# Patient Record
Sex: Male | Born: 1990 | Race: White | Hispanic: No | Marital: Single | Smoking: Current every day smoker
Health system: Southern US, Community
[De-identification: ages and names within clinical notes are randomized; demographics above are authoritative.]

## PROBLEM LIST (undated history)

## (undated) DIAGNOSIS — F988 Other specified behavioral and emotional disorders with onset usually occurring in childhood and adolescence: Secondary | ICD-10-CM

## (undated) HISTORY — DX: Other specified behavioral and emotional disorders with onset usually occurring in childhood and adolescence: F98.8

---
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
## 2010-03-16 ENCOUNTER — Emergency Department (HOSPITAL_BASED_OUTPATIENT_CLINIC_OR_DEPARTMENT_OTHER): Admission: EM | Admit: 2010-03-16 | Discharge: 2010-03-16 | Payer: Self-pay | Admitting: Emergency Medicine

## 2011-02-16 ENCOUNTER — Ambulatory Visit (INDEPENDENT_AMBULATORY_CARE_PROVIDER_SITE_OTHER): Payer: PRIVATE HEALTH INSURANCE | Admitting: Family

## 2011-02-16 ENCOUNTER — Encounter: Payer: Self-pay | Admitting: Family

## 2011-02-16 VITALS — BP 122/76 | HR 66 | Temp 97.8°F | Resp 16 | Ht 67.0 in | Wt 154.0 lb

## 2011-02-16 DIAGNOSIS — Z720 Tobacco use: Secondary | ICD-10-CM

## 2011-02-16 DIAGNOSIS — B360 Pityriasis versicolor: Secondary | ICD-10-CM | POA: Insufficient documentation

## 2011-02-16 DIAGNOSIS — Z5181 Encounter for therapeutic drug level monitoring: Secondary | ICD-10-CM

## 2011-02-16 DIAGNOSIS — F988 Other specified behavioral and emotional disorders with onset usually occurring in childhood and adolescence: Secondary | ICD-10-CM

## 2011-02-16 DIAGNOSIS — B354 Tinea corporis: Secondary | ICD-10-CM

## 2011-02-16 DIAGNOSIS — F17200 Nicotine dependence, unspecified, uncomplicated: Secondary | ICD-10-CM

## 2011-02-16 DIAGNOSIS — J45909 Unspecified asthma, uncomplicated: Secondary | ICD-10-CM

## 2011-02-16 LAB — HEPATIC FUNCTION PANEL
ALT: 12 U/L (ref 0–53)
AST: 27 U/L (ref 0–37)
Alkaline Phosphatase: 74 U/L (ref 39–117)
Indirect Bilirubin: 0.4 mg/dL (ref 0.0–0.9)
Total Protein: 7.2 g/dL (ref 6.0–8.3)

## 2011-02-16 LAB — BASIC METABOLIC PANEL
CO2: 28 mEq/L (ref 19–32)
Chloride: 100 mEq/L (ref 96–112)
Sodium: 138 mEq/L (ref 135–145)

## 2011-02-16 MED ORDER — TERBINAFINE HCL 250 MG PO TABS: 250.0000 mg | ORAL_TABLET | Freq: Every day | ORAL | Status: AC

## 2011-02-16 MED ORDER — VARENICLINE TARTRATE 0.5 MG X 11 & 1 MG X 42 PO MISC: ORAL | Status: AC

## 2011-02-16 MED ORDER — VARENICLINE TARTRATE 1 MG PO TABS: 1.0000 mg | ORAL_TABLET | Freq: Two times a day (BID) | ORAL | Status: AC

## 2011-02-16 NOTE — Progress Notes (Signed)
Subjective:    Patient ID: Nicholas [name], male    DOB: 11/15/1990, 20 y.o.   MRN: [id number]  HPI  [name] is a 20 yr old male who presents today to establish care.  He has a history of ADD.  Recently graduated and works at [employer] and [employer].  He reports that he is unable to concentrate on many things for a period of time.  "zones off." Was treated in the past and underwent evaluation as a child and was told that he had ADD.  Last used Adderall 4-5 yrs ago.  He previously saw Dr. Ivory Broad.  Asthma- has not bothered him "in a long time."  Rare asthma attack.  Last episode was 1 year ago.    Tobacco abuse- wants to quit.  Interested in Chantix.  He has tried the nicotine patch.  He has tried the patch and gum without success.  Rash- has round lesions on the abdomen.   Review of Systems  Constitutional: Negative for fever and unexpected weight change.  HENT: Negative for hearing loss.   Eyes: Negative for visual disturbance.  Respiratory: Negative for cough.   Cardiovascular: Negative for chest pain.  Gastrointestinal: Negative for nausea, vomiting and diarrhea.  Genitourinary: Negative for dysuria and hematuria.  Musculoskeletal: Positive for back pain. Negative for myalgias.  Neurological:       Occasional headaches.  Hematological: Negative for adenopathy.  Psychiatric/Behavioral:       Denies hx of depression.  Notes some anxiety when he gets "stress out." this happens about once a month.    Past Medical History  Diagnosis Date  . Asthma     childhood  . ADD (attention deficit disorder)     History   Social History  . Marital Status: Single    Spouse Name: N/A    Number of Children: 0  . Years of Education: N/A   Occupational History  .  [employer]   Social History Main Topics  . Smoking status: Current Everyday Smoker -- 1.0 packs/day for 6 years    Types: Cigarettes  . Smokeless tobacco: Never Used  . Alcohol Use: No  . Drug Use: No  . Sexually  Active: Not on file   Other Topics Concern  . Not on file   Social History Narrative   Regular Exercise:  2-3 x weeklyCaffeine Use: 1 daily    Past Surgical History  Procedure Date  . External ear surgery 2010    bilateral "plastic surgery"    Family History  Problem Relation Age of Onset  . Arthritis Mother   . Diabetes Maternal Grandmother     No Known Allergies  No current outpatient prescriptions on file prior to visit.    BP 122/76  Pulse 66  Temp(Src) 97.8 F (36.6 C) (Oral)  Resp 16  Ht 5\' 7"  (1.702 m)  Wt 154 lb (69.854 kg)  BMI 24.12 kg/m2       Objective:   Physical Exam  Constitutional: He is oriented to person, place, and time. He appears well-developed and well-nourished.  HENT:  Head: Normocephalic and atraumatic.  Cardiovascular: Normal rate and regular rhythm.   Pulmonary/Chest: Effort normal and breath sounds normal.  Abdominal: Soft. Bowel sounds are normal. He exhibits no distension and no mass. There is no tenderness. There is no rebound and no guarding.  Musculoskeletal: He exhibits no edema.  Neurological: He is alert and oriented to person, place, and time.  Skin:       +  tattoos noted on bilateral arms. Several Pink round raised lesions noted on abdomen  Psychiatric: He has a normal mood and affect. His behavior is normal. Judgment and thought content normal.          Assessment & Plan:

## 2011-02-16 NOTE — Assessment & Plan Note (Signed)
Pt reports that this is stable.

## 2011-02-16 NOTE — Patient Instructions (Addendum)
Complete your lab work on the first floor.  Call if you develop depression or suicidal thoughts on chantix. Quit smoking within 1 week after starting chantix. You will be contacted about your referral for your ADD evaluation.  Call if your rash worsens or does not resolve.  Follow up in 3 months.

## 2011-02-16 NOTE — Assessment & Plan Note (Signed)
Patient was counseled on smoking cessation for approximately 5 minutes today. He will be started on Chantix. Side effects were discussed including the risk of worsening depression symptoms, dizziness, nausea, and vivid dreams.

## 2011-02-16 NOTE — Assessment & Plan Note (Addendum)
Uncontrolled.I discussed with the patient and that he will need to undergo a formal evaluation for ADD before I will be able to prescribe any further Adderall. He has been off the medication now for 4-5 years and has not had an evaluation since he was young child. We'll make referral to [facility] for further evaluation.

## 2011-02-16 NOTE — Assessment & Plan Note (Signed)
Will treat with oral Lamisil for 2 weeks. Check baseline creatinine and liver function testing today.

## 2011-02-18 ENCOUNTER — Encounter: Payer: Self-pay | Admitting: Family

## 2011-05-20 ENCOUNTER — Ambulatory Visit: Payer: PRIVATE HEALTH INSURANCE | Admitting: Family

## 2011-05-20 DIAGNOSIS — Z0289 Encounter for other administrative examinations: Secondary | ICD-10-CM

## 2012-02-22 ENCOUNTER — Emergency Department (HOSPITAL_COMMUNITY)
Admission: EM | Admit: 2012-02-22 | Discharge: 2012-02-22 | Disposition: A | Discharge status: Home / Self Care | Payer: Worker's Compensation | Attending: Emergency Medicine | Admitting: Emergency Medicine

## 2012-02-22 ENCOUNTER — Emergency Department (HOSPITAL_COMMUNITY): Payer: Worker's Compensation

## 2012-02-22 ENCOUNTER — Encounter (HOSPITAL_COMMUNITY): Payer: Self-pay | Admitting: Emergency Medicine

## 2012-02-22 DIAGNOSIS — J45909 Unspecified asthma, uncomplicated: Secondary | ICD-10-CM | POA: Insufficient documentation

## 2012-02-22 DIAGNOSIS — F988 Other specified behavioral and emotional disorders with onset usually occurring in childhood and adolescence: Secondary | ICD-10-CM | POA: Insufficient documentation

## 2012-02-22 DIAGNOSIS — W240XXA Contact with lifting devices, not elsewhere classified, initial encounter: Secondary | ICD-10-CM | POA: Insufficient documentation

## 2012-02-22 DIAGNOSIS — Y9289 Other specified places as the place of occurrence of the external cause: Secondary | ICD-10-CM | POA: Insufficient documentation

## 2012-02-22 DIAGNOSIS — S9780XA Crushing injury of unspecified foot, initial encounter: Secondary | ICD-10-CM | POA: Insufficient documentation

## 2012-02-22 DIAGNOSIS — Z87891 Personal history of nicotine dependence: Secondary | ICD-10-CM | POA: Insufficient documentation

## 2012-02-22 MED ORDER — IBUPROFEN 800 MG PO TABS: 800.0000 mg | ORAL_TABLET | Freq: Three times a day (TID) | ORAL | Status: AC

## 2012-02-22 MED ORDER — ONDANSETRON HCL 4 MG PO TABS: 4.0000 mg | ORAL_TABLET | Freq: Four times a day (QID) | ORAL | Status: AC

## 2012-02-22 MED ORDER — ONDANSETRON 4 MG PO TBDP
4.0000 mg | ORAL_TABLET | Freq: Once | ORAL | Status: AC
  Administered 2012-02-22: 4 mg via ORAL
  Filled 2012-02-22: qty 1

## 2012-02-22 MED ORDER — OXYCODONE-ACETAMINOPHEN 5-325 MG PO TABS
2.0000 | ORAL_TABLET | Freq: Once | ORAL | Status: AC
  Administered 2012-02-22: 2 via ORAL
  Filled 2012-02-22: qty 2

## 2012-02-22 MED ORDER — HYDROCODONE-ACETAMINOPHEN 5-500 MG PO TABS: 1.0000 | ORAL_TABLET | Freq: Four times a day (QID) | ORAL | Status: AC | PRN

## 2012-02-22 NOTE — Progress Notes (Signed)
Orthopedic Tech Progress Note Patient Details:  Nicholas [name] [birth year] [id number]  Ortho Devices Type of Ortho Device: Crutches;CAM walker Ortho Device/Splint Location: left LE Ortho Device/Splint Interventions: Application Pt expressed he is comfortable using crutches.  Linn Clavin T 02/22/2012, 11:55 AM

## 2012-02-22 NOTE — ED Notes (Signed)
Ortho at bedside placing cam walker and giving crutches with instructions.

## 2012-02-22 NOTE — ED Notes (Signed)
Patient claims works for [employer].  Patient claims his coworker accidentally ran over his L foot/ankle with a forklift.

## 2012-02-22 NOTE — ED Provider Notes (Signed)
History     CSN: [id number]  Arrival date & time 02/22/12  7829   First MD Initiated Contact with Patient 02/22/12 [phone]      Chief Complaint  Patient presents with  . Foot Injury    (Consider location/radiation/quality/duration/timing/severity/associated sxs/prior treatment) HPI  Patient presents to the ER with complaints of left foot injury. The patient was at work when a fork lift ran over his foot just prior to arrival at the ED. The foot is bruised and swollen with no open wounds. He states it hurts to walk on it. He denies falling down, hitting his head, injuring his neck, or LOC. The pt denies numbness or tingling. NAD/VSS  Past Medical History  Diagnosis Date  . Asthma     childhood  . ADD (attention deficit disorder)     Past Surgical History  Procedure Date  . External ear surgery 2010    bilateral "plastic surgery"    Family History  Problem Relation Age of Onset  . Arthritis Mother   . Diabetes Maternal Grandmother     History  Substance Use Topics  . Smoking status: Former Smoker -- 6 years    Types: Cigarettes  . Smokeless tobacco: Never Used  . Alcohol Use: No      Review of Systems   HEENT: denies blurry vision or change in hearing PULMONARY: Denies difficulty breathing and SOB CARDIAC: denies chest pain or heart palpitations MUSCULOSKELETAL:  + injury to left foot ABDOMEN AL: denies abdominal pain GU: denies loss of bowel or urinary control NEURO: denies numbness and tingling in extremities SKIN: no new rashes PSYCH: patient denies anxiety or depression. NECK: Pt denies having neck pain     Allergies  Review of patient's allergies indicates no known allergies.  Home Medications   Current Outpatient Rx  Name Route Sig Dispense Refill  . ADULT MULTIVITAMIN W/MINERALS CH Oral Take 1 tablet by mouth daily.    Marland Kitchen VARENICLINE TARTRATE 0.5 MG PO TABS Oral Take 0.5 mg by mouth 2 (two) times daily.      BP 142/83  Pulse 105  Temp  98.2 F (36.8 C) (Oral)  Resp 18  SpO2 100%  Physical Exam  Nursing note and vitals reviewed. Constitutional: He appears well-developed and well-nourished. No distress.  HENT:  Head: Normocephalic and atraumatic.  Eyes: Pupils are equal, round, and reactive to light.  Neck: Normal range of motion. Neck supple.  Cardiovascular: Normal rate and regular rhythm.   Pulmonary/Chest: Effort normal.  Abdominal: Soft.  Musculoskeletal:       Left foot: He exhibits decreased range of motion, tenderness, bony tenderness and swelling. He exhibits normal capillary refill, no crepitus, no deformity and no laceration.       Feet:       Lateral portion of patients foot is very swollen with mod/severe tenderness. Skin is warm and moist, pedal pulse and capillary refill are adequate. The patient has no open wounds or deformities.   Neurological: He is alert.  Skin: Skin is warm and dry.    ED Course  Procedures (including critical care time)  Labs Reviewed - No data to display Dg Ankle Complete Left  02/22/2012  *RADIOLOGY REPORT*  Clinical Data: Injury with pain and swelling  LEFT ANKLE COMPLETE - 3+ VIEW  Comparison: None.  Findings: No evidence of fracture, dislocation or joint effusion.  IMPRESSION: Negative radiographs  Original Report Authenticated By: Thomasenia Sales, M.D.   Ct Foot Left Wo Contrast  02/22/2012  *  RADIOLOGY REPORT*  Clinical Data: Injured foot.  Run over by a fork lift.  Question of cuboid fracture on radiographs.  CT OF THE LEFT FOOT WITHOUT CONTRAST  Technique:  Multidetector CT imaging was performed according to the standard protocol. Multiplanar CT image reconstructions were also generated.  Comparison: Radiographs 02/22/2012.  Findings: No acute foot fracture is identified.  The cuboid abnormality on the radiographs is a small intraosseous lipoma. There is a large, 2.3 cm, benign bone cyst in the calcaneus also.  The tibiotalar joint spaces maintained.  No osteochondral  abnormality.  The subtalar joints are maintained.  Os trigonum is noted.  The midfoot bony structures are intact.  Joint spaces are maintained.  There is significant dorsal soft tissue swelling over the midfoot and forefoot likely from direct trauma.  IMPRESSION:  1.  No acute foot fracture. 2.  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 3.  Large benign bone cyst in the calcaneus. 4.  Significant dorsal soft tissue swelling/edema involving the forefoot and midfoot.  Original Report Authenticated By: P. Loralie Champagne, M.D.   Dg Foot Complete Left  02/22/2012  *RADIOLOGY REPORT*  Clinical Data: Injury.  Pain and swelling.  LEFT FOOT - COMPLETE 3+ VIEW  Comparison: None  Findings:  On the oblique view, there is question of a the cuboid fracture.  I do not see any acute white abnormality on the other projections. No other abnormalities suspected.  IMPRESSION: Possible small fracture of the cuboid suggested only on the oblique view.  Other views are normal.  Is there tenderness in this location?  Original Report Authenticated By: Thomasenia Sales, M.D.     1. Cuboid fracture       MDM  I consulted Dr. Lestine Box about patients xray findings, he suggests CT of Foot for a better view and to r/o occult fractures. Turns out, CT Foot shows no cuboidal fracture but that the pt has a lipoma. No fractures noted. Significant swelling. Pt educated about risk for compartment syndrome. Pt given referral to Dr. Lestine Box office. Given cam walker boot, crutches, pain medication and a work note. RICE guidelines given.  Pt has been advised of the symptoms that warrant their return to the ED. Patient has voiced understanding and has agreed to follow-up with the PCP or specialist.         Dorthula Matas, PA 02/22/12 1127

## 2012-02-24 NOTE — ED Provider Notes (Signed)
Medical screening examination/treatment/procedure(s) were performed by non-physician practitioner and as supervising physician I was immediately available for consultation/collaboration.  Evelina Lore R. Vern Guerette, MD 02/24/12 1053 

## 2012-07-17 ENCOUNTER — Emergency Department (HOSPITAL_COMMUNITY): Payer: Worker's Compensation

## 2012-07-17 ENCOUNTER — Emergency Department (HOSPITAL_COMMUNITY)
Admission: EM | Admit: 2012-07-17 | Discharge: 2012-07-17 | Disposition: A | Discharge status: Home / Self Care | Payer: Worker's Compensation | Attending: Emergency Medicine | Admitting: Emergency Medicine

## 2012-07-17 DIAGNOSIS — T148XXA Other injury of unspecified body region, initial encounter: Secondary | ICD-10-CM

## 2012-07-17 DIAGNOSIS — J45909 Unspecified asthma, uncomplicated: Secondary | ICD-10-CM | POA: Insufficient documentation

## 2012-07-17 DIAGNOSIS — Y9269 Other specified industrial and construction area as the place of occurrence of the external cause: Secondary | ICD-10-CM | POA: Insufficient documentation

## 2012-07-17 DIAGNOSIS — F988 Other specified behavioral and emotional disorders with onset usually occurring in childhood and adolescence: Secondary | ICD-10-CM | POA: Insufficient documentation

## 2012-07-17 DIAGNOSIS — Y9389 Activity, other specified: Secondary | ICD-10-CM | POA: Insufficient documentation

## 2012-07-17 DIAGNOSIS — Z87891 Personal history of nicotine dependence: Secondary | ICD-10-CM | POA: Insufficient documentation

## 2012-07-17 DIAGNOSIS — W268XXA Contact with other sharp object(s), not elsewhere classified, initial encounter: Secondary | ICD-10-CM | POA: Insufficient documentation

## 2012-07-17 DIAGNOSIS — S91309A Unspecified open wound, unspecified foot, initial encounter: Secondary | ICD-10-CM | POA: Insufficient documentation

## 2012-07-17 DIAGNOSIS — Z23 Encounter for immunization: Secondary | ICD-10-CM | POA: Insufficient documentation

## 2012-07-17 MED ORDER — CIPROFLOXACIN HCL 500 MG PO TABS
500.0000 mg | ORAL_TABLET | Freq: Once | ORAL | Status: AC
  Administered 2012-07-17: 500 mg via ORAL
  Filled 2012-07-17: qty 1

## 2012-07-17 MED ORDER — TETANUS-DIPHTH-ACELL PERTUSSIS 5-2.5-18.5 LF-MCG/0.5 IM SUSP
0.5000 mL | Freq: Once | INTRAMUSCULAR | Status: AC
  Administered 2012-07-17: 0.5 mL via INTRAMUSCULAR
  Filled 2012-07-17: qty 0.5

## 2012-07-17 MED ORDER — HYDROCODONE-ACETAMINOPHEN 5-325 MG PO TABS: 1.0000 | ORAL_TABLET | ORAL | Status: DC | PRN

## 2012-07-17 MED ORDER — CIPROFLOXACIN HCL 500 MG PO TABS: 500.0000 mg | ORAL_TABLET | Freq: Two times a day (BID) | ORAL | Status: AC

## 2012-07-17 NOTE — ED Provider Notes (Signed)
Medical screening examination/treatment/procedure(s) were performed by non-physician practitioner and as supervising physician I was immediately available for consultation/collaboration.   Dione Booze, MD 07/17/12 [phone]

## 2012-07-17 NOTE — ED Notes (Signed)
Pt soaking L foot in warm water and diluted chlorahexinate.

## 2012-07-17 NOTE — ED Notes (Signed)
Pt stepped on a nail this morning with his left foot. Pt states nail went at least 3/4-1 inch into his foot and then came out. Pt c/o swelling and pain to L foot. Pt ambulatory at triage.

## 2012-07-17 NOTE — ED Provider Notes (Signed)
History     CSN: [id number]  Arrival date & time 07/17/12  1748   First MD Initiated Contact with Patient 07/17/12 1816      Chief Complaint  Patient presents with  . Foot Injury    (Consider location/radiation/quality/duration/timing/severity/associated sxs/prior treatment) Patient is a 21 y.o. male presenting with foot injury. The history is provided by the patient.  Foot Injury  The incident occurred 6 to 12 hours ago. The incident occurred at work. Injury mechanism: He stepped on a nail causing puncture would through his shoe. He reports no foreign bodies present.    Past Medical History  Diagnosis Date  . Asthma     childhood  . ADD (attention deficit disorder)     Past Surgical History  Procedure Date  . External ear surgery 2010    bilateral "plastic surgery"    Family History  Problem Relation Age of Onset  . Arthritis Mother   . Diabetes Maternal Grandmother     History  Substance Use Topics  . Smoking status: Former Smoker -- 6 years    Types: Cigarettes  . Smokeless tobacco: Never Used  . Alcohol Use: No      Review of Systems  Constitutional: Negative for fever.  Skin: Positive for wound.    Allergies  Review of patient's allergies indicates no known allergies.  Home Medications   Current Outpatient Rx  Name  Route  Sig  Dispense  Refill  . ACETAMINOPHEN 325 MG PO TABS   Oral   Take 325 mg by mouth every 6 (six) hours as needed. pain         . ADULT MULTIVITAMIN W/MINERALS CH   Oral   Take 1 tablet by mouth daily.           BP 129/72  Pulse 76  Temp 98.5 F (36.9 C) (Oral)  Resp 16  SpO2 100%  Physical Exam  Constitutional: He is oriented to person, place, and time. He appears well-developed and well-nourished. No distress.  Pulmonary/Chest: Effort normal.  Neurological: He is alert and oriented to person, place, and time.  Skin:       Small puncture wound heel of left foot. No associated swelling, drainage or  redness.     ED Course  Procedures (including critical care time)  Labs Reviewed - No data to display No results found.   No diagnosis found.  1. Puncture wound, foot  MDM  Tetanus updated, foot cleaned. Abx started, pain relief given.        Rodena Medin, PA-C 07/17/12 [phone]

## 2013-02-11 ENCOUNTER — Emergency Department (HOSPITAL_BASED_OUTPATIENT_CLINIC_OR_DEPARTMENT_OTHER): Payer: PRIVATE HEALTH INSURANCE

## 2013-02-11 ENCOUNTER — Emergency Department (HOSPITAL_BASED_OUTPATIENT_CLINIC_OR_DEPARTMENT_OTHER)
Admission: EM | Admit: 2013-02-11 | Discharge: 2013-02-11 | Disposition: A | Discharge status: Home / Self Care | Payer: PRIVATE HEALTH INSURANCE | Attending: Emergency Medicine | Admitting: Emergency Medicine

## 2013-02-11 ENCOUNTER — Encounter (HOSPITAL_BASED_OUTPATIENT_CLINIC_OR_DEPARTMENT_OTHER): Payer: Self-pay | Admitting: *Deleted

## 2013-02-11 DIAGNOSIS — R002 Palpitations: Secondary | ICD-10-CM | POA: Insufficient documentation

## 2013-02-11 DIAGNOSIS — R0789 Other chest pain: Secondary | ICD-10-CM

## 2013-02-11 DIAGNOSIS — J45901 Unspecified asthma with (acute) exacerbation: Secondary | ICD-10-CM | POA: Insufficient documentation

## 2013-02-11 DIAGNOSIS — Z87891 Personal history of nicotine dependence: Secondary | ICD-10-CM | POA: Insufficient documentation

## 2013-02-11 DIAGNOSIS — Z8659 Personal history of other mental and behavioral disorders: Secondary | ICD-10-CM | POA: Insufficient documentation

## 2013-02-11 LAB — BASIC METABOLIC PANEL
CO2: 28 mEq/L (ref 19–32)
Chloride: 101 mEq/L (ref 96–112)
GFR calc Af Amer: >90 mL/min (ref >90)
Potassium: 3.7 mEq/L (ref 3.5–5.1)

## 2013-02-11 LAB — TROPONIN I: Troponin I: <0.3 ng/mL (ref <0.30)

## 2013-02-11 LAB — CBC WITH DIFFERENTIAL/PLATELET
Basophils Absolute: 0.1 10*3/uL (ref 0.0–0.1)
Basophils Relative: 1 % (ref 0–1)
Hemoglobin: 14.3 g/dL (ref 13.0–17.0)
Lymphocytes Relative: 40 % (ref 12–46)
MCHC: 34.8 g/dL (ref 30.0–36.0)
Neutro Abs: 2.9 10*3/uL (ref 1.7–7.7)
Neutrophils Relative %: 47 % (ref 43–77)
RDW: 12.2 % (ref 11.5–15.5)
WBC: 6.2 10*3/uL (ref 4.0–10.5)

## 2013-02-11 MED ORDER — MORPHINE SULFATE 4 MG/ML IJ SOLN
4.0000 mg | Freq: Once | INTRAMUSCULAR | Status: AC
  Administered 2013-02-11: 4 mg via INTRAVENOUS
  Filled 2013-02-11: qty 1

## 2013-02-11 MED ORDER — NITROGLYCERIN 0.4 MG SL SUBL
0.4000 mg | SUBLINGUAL_TABLET | SUBLINGUAL | Status: DC | PRN
  Filled 2013-02-11: qty 25

## 2013-02-11 MED ORDER — ASPIRIN 81 MG PO CHEW
324.0000 mg | CHEWABLE_TABLET | Freq: Once | ORAL | Status: AC
  Administered 2013-02-11: 324 mg via ORAL
  Filled 2013-02-11: qty 4

## 2013-02-11 NOTE — ED Provider Notes (Signed)
Medical screening examination/treatment/procedure(s) were performed by non-physician practitioner and as supervising physician I was immediately available for consultation/collaboration.   Torryn Fiske, MD 02/11/13 2247 

## 2013-02-11 NOTE — ED Notes (Signed)
Feels like his heart is beating irregularly. Chest pain on and off for 3 weeks. Denies drug use. States he has been smoking more and has been under a lot of stress.

## 2013-02-11 NOTE — ED Provider Notes (Signed)
History    CSN: [id number] Arrival date & time 02/11/13  1847  First MD Initiated Contact with Patient 02/11/13 1859     Chief Complaint  Patient presents with  . Palpitations   (Consider location/radiation/quality/duration/timing/severity/associated sxs/prior Treatment) HPI Comments: Patient presents to the emergency department with chief complaint of chest pressure. He states that he has had the pressure for the past 3 weeks. He states that his been constant, and nothing makes it better or worse. He has not tried anything to alleviate his symptoms. He states that he does smoke a lot, but wants to quit. He denies any recreational drug use. He endorses associated shortness of breath, but this is not exertional. Cardiac risk factors include smoking history only.  The history is provided by the patient. No language interpreter was used.   Past Medical History  Diagnosis Date  . Asthma     childhood  . ADD (attention deficit disorder)    Past Surgical History  Procedure Laterality Date  . External ear surgery  2010    bilateral "plastic surgery"   Family History  Problem Relation Age of Onset  . Arthritis Mother   . Diabetes Maternal Grandmother    History  Substance Use Topics  . Smoking status: Former Smoker -- 6 years    Types: Cigarettes  . Smokeless tobacco: Never Used  . Alcohol Use: No    Review of Systems  All other systems reviewed and are negative.    Allergies  Review of patient's allergies indicates no known allergies.  Home Medications   Current Outpatient Rx  Name  Route  Sig  Dispense  Refill  . acetaminophen (TYLENOL) 325 MG tablet   Oral   Take 325 mg by mouth every 6 (six) hours as needed. pain         . ciprofloxacin (CIPRO) 500 MG tablet   Oral   Take 1 tablet (500 mg total) by mouth every 12 (twelve) hours.   20 tablet   0   . HYDROcodone-acetaminophen (NORCO/VICODIN) 5-325 MG per tablet   Oral   Take 1-2 tablets by mouth every 4  (four) hours as needed for pain.   15 tablet   0   . Multiple Vitamin (MULTIVITAMIN WITH MINERALS) TABS   Oral   Take 1 tablet by mouth daily.          BP 133/75  Pulse 87  Temp(Src) 98.1 F (36.7 C) (Oral)  Resp 22  Wt 154 lb (69.854 kg)  BMI 24.11 kg/m2  SpO2 100% Physical Exam  Nursing note and vitals reviewed. Constitutional: He is oriented to person, place, and time. He appears well-developed and well-nourished.  HENT:  Head: Normocephalic and atraumatic.  Eyes: Conjunctivae and EOM are normal. Pupils are equal, round, and reactive to light. Right eye exhibits no discharge. Left eye exhibits no discharge. No scleral icterus.  Neck: Normal range of motion. Neck supple. No JVD present.  Cardiovascular: Normal rate, regular rhythm, normal heart sounds and intact distal pulses.  Exam reveals no gallop and no friction rub.   No murmur heard. Pulmonary/Chest: Effort normal and breath sounds normal. No respiratory distress. He has no wheezes. He has no rales. He exhibits no tenderness.  Abdominal: Soft. Bowel sounds are normal. He exhibits no distension and no mass. There is no tenderness. There is no rebound and no guarding.  Musculoskeletal: Normal range of motion. He exhibits no edema and no tenderness.  Neurological: He is alert and oriented to  person, place, and time.  CN 3-12 intact  Skin: Skin is warm and dry.  Psychiatric: He has a normal mood and affect. His behavior is normal. Judgment and thought content normal.    ED Course  Procedures (including critical care time) Labs Reviewed  CBC WITH DIFFERENTIAL  BASIC METABOLIC PANEL  TROPONIN I  ED ECG REPORT  I personally interpreted this EKG   Date: 02/11/2013   Rate: 78  Rhythm: normal sinus rhythm  QRS Axis: normal  Intervals: normal  ST/T Wave abnormalities: J-point elevations in anterior leads  Conduction Disutrbances:right bundle branch block  Narrative Interpretation:   Old EKG Reviewed: none  available  Results for orders placed during the hospital encounter of 02/11/13  CBC WITH DIFFERENTIAL      Result Value Range   WBC 6.2  4.0 - 10.5 K/uL   RBC 4.53  4.22 - 5.81 MIL/uL   Hemoglobin 14.3  13.0 - 17.0 g/dL   HCT 45.4  09.8 - 11.9 %   MCV 90.7  78.0 - 100.0 fL   MCH 31.6  26.0 - 34.0 pg   MCHC 34.8  30.0 - 36.0 g/dL   RDW 14.7  82.9 - 56.2 %   Platelets 283  150 - 400 K/uL   Neutrophils Relative % 47  43 - 77 %   Neutro Abs 2.9  1.7 - 7.7 K/uL   Lymphocytes Relative 40  12 - 46 %   Lymphs Abs 2.5  0.7 - 4.0 K/uL   Monocytes Relative 10  3 - 12 %   Monocytes Absolute 0.6  0.1 - 1.0 K/uL   Eosinophils Relative 3  0 - 5 %   Eosinophils Absolute 0.2  0.0 - 0.7 K/uL   Basophils Relative 1  0 - 1 %   Basophils Absolute 0.1  0.0 - 0.1 K/uL  BASIC METABOLIC PANEL      Result Value Range   Sodium 139  135 - 145 mEq/L   Potassium 3.7  3.5 - 5.1 mEq/L   Chloride 101  96 - 112 mEq/L   CO2 28  19 - 32 mEq/L   Glucose, Bld 107 (*) 70 - 99 mg/dL   BUN 14  6 - 23 mg/dL   Creatinine, Ser 1.30  0.50 - 1.35 mg/dL   Calcium 9.7  8.4 - 86.5 mg/dL   GFR calc non Af Amer >90  >90 mL/min   GFR calc Af Amer >90  >90 mL/min  TROPONIN I      Result Value Range   Troponin I <0.30  <0.30 ng/mL   Dg Chest 2 View  02/11/2013   *RADIOLOGY REPORT*  Clinical Data: Shortness of breath  CHEST - 2 VIEW  Comparison: None.  Findings: Lungs clear.  Heart size and pulmonary vascularity are normal.  No adenopathy.  No bone lesions.  IMPRESSION: No abnormality noted.   Original Report Authenticated By: Bretta Bang, M.D.     1. Chest pressure   2. Palpitations     MDM  Patient with chest pressure x3 weeks. He states that recently worsened, we'll check basic labs, and reevaluate.    8:48 PM Patient states that he is feeling a little better after treatment in the emergency department. Labs and imaging a reassuring. Patient states that he drinks a lot of energy drinks. I suspect that this  could be the cause of his palpitations. Have instructed him to quit using them. Cardiology followup.  Patient discussed with Dr. Fredderick Phenix, who  agrees with the plan.   Roxy Horseman, PA-C 02/11/13 2048

## 2013-03-19 ENCOUNTER — Emergency Department (HOSPITAL_BASED_OUTPATIENT_CLINIC_OR_DEPARTMENT_OTHER)
Admission: EM | Admit: 2013-03-19 | Discharge: 2013-03-19 | Disposition: A | Discharge status: Home / Self Care | Payer: PRIVATE HEALTH INSURANCE | Attending: Emergency Medicine | Admitting: Emergency Medicine

## 2013-03-19 ENCOUNTER — Encounter (HOSPITAL_BASED_OUTPATIENT_CLINIC_OR_DEPARTMENT_OTHER): Payer: Self-pay | Admitting: *Deleted

## 2013-03-19 DIAGNOSIS — Z8659 Personal history of other mental and behavioral disorders: Secondary | ICD-10-CM | POA: Insufficient documentation

## 2013-03-19 DIAGNOSIS — J45909 Unspecified asthma, uncomplicated: Secondary | ICD-10-CM | POA: Insufficient documentation

## 2013-03-19 DIAGNOSIS — IMO0001 Reserved for inherently not codable concepts without codable children: Secondary | ICD-10-CM | POA: Insufficient documentation

## 2013-03-19 DIAGNOSIS — F17200 Nicotine dependence, unspecified, uncomplicated: Secondary | ICD-10-CM | POA: Insufficient documentation

## 2013-03-19 DIAGNOSIS — F1120 Opioid dependence, uncomplicated: Secondary | ICD-10-CM | POA: Insufficient documentation

## 2013-03-19 DIAGNOSIS — IMO0002 Reserved for concepts with insufficient information to code with codable children: Secondary | ICD-10-CM

## 2013-03-19 LAB — URINALYSIS, ROUTINE W REFLEX MICROSCOPIC
Bilirubin Urine: NEGATIVE
Glucose, UA: NEGATIVE mg/dL
Hgb urine dipstick: NEGATIVE
Ketones, ur: 15 mg/dL — AB
Leukocytes, UA: NEGATIVE
pH: 5.5 (ref 5.0–8.0)

## 2013-03-19 LAB — RAPID URINE DRUG SCREEN, HOSP PERFORMED
Benzodiazepines: NOT DETECTED
Cocaine: NOT DETECTED
Opiates: POSITIVE — AB

## 2013-03-19 MED ORDER — CLONIDINE HCL 0.1 MG PO TABS
0.1000 mg | ORAL_TABLET | Freq: Once | ORAL | Status: AC
  Administered 2013-03-19: 0.1 mg via ORAL
  Filled 2013-03-19: qty 1

## 2013-03-19 MED ORDER — DIPHENHYDRAMINE HCL 25 MG PO CAPS
25.0000 mg | ORAL_CAPSULE | Freq: Once | ORAL | Status: AC
  Administered 2013-03-19: 25 mg via ORAL
  Filled 2013-03-19: qty 1

## 2013-03-19 MED ORDER — ONDANSETRON 4 MG PO TBDP
4.0000 mg | ORAL_TABLET | Freq: Once | ORAL | Status: AC
  Administered 2013-03-19: 4 mg via ORAL
  Filled 2013-03-19 (×2): qty 1

## 2013-03-19 MED ORDER — ONDANSETRON 4 MG PO TBDP: 4.0000 mg | ORAL_TABLET | Freq: Three times a day (TID) | ORAL | Status: AC | PRN

## 2013-03-19 MED ORDER — CLONIDINE HCL 0.2 MG PO TABS: 0.2000 mg | ORAL_TABLET | Freq: Two times a day (BID) | ORAL | Status: AC

## 2013-03-19 NOTE — ED Notes (Signed)
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 States that uses regularly for the past 4-5 months. Last use of both was 12 hours ago.

## 2013-03-19 NOTE — ED Provider Notes (Signed)
CSN: [id number]     Arrival date & time 03/19/13  1558 History   First MD Initiated Contact with Patient 03/19/13 1625     Chief Complaint  Patient presents with  . Addiction Problem   (Consider location/radiation/quality/duration/timing/severity/associated sxs/prior Treatment) Patient is a 22 y.o. male presenting with drug problem. The history is provided by the patient. No language interpreter was used.  Drug Problem This is a new problem. Episode onset: 4 months. The problem occurs constantly. The problem has been gradually worsening. Associated symptoms include myalgias. Nothing aggravates the symptoms. He has tried nothing for the symptoms.  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   Pt reports he is trying to get into [facility] for inpatient treatment.  Pt last used 14 hours ago.  Pt complains of withdrawal.  Pt requesting medication to help with withdrawal.    Past Medical History  Diagnosis Date  . Asthma     childhood  . ADD (attention deficit disorder)    Past Surgical History  Procedure Laterality Date  . External ear surgery  2010    bilateral "plastic surgery"   Family History  Problem Relation Age of Onset  . Arthritis Mother   . Diabetes Maternal Grandmother    History  Substance Use Topics  . Smoking status: Current Every Day Smoker -- 6 years    Types: Cigarettes  . Smokeless tobacco: Never Used  . Alcohol Use: Yes    Review of Systems  Musculoskeletal: Positive for myalgias.  All other systems reviewed and are negative.    Allergies  Review of patient's allergies indicates no known allergies.  Home Medications   Current Outpatient Rx  Name  Route  Sig  Dispense  Refill  . acetaminophen (TYLENOL) 325 MG tablet   Oral   Take 325 mg by mouth every 6 (six) hours as needed. pain         . ciprofloxacin (CIPRO) 500 MG tablet   Oral   Take 1 tablet (500 mg total) by mouth every 12 (twelve) hours.   20 tablet   0   . HYDROcodone-acetaminophen  (NORCO/VICODIN) 5-325 MG per tablet   Oral   Take 1-2 tablets by mouth every 4 (four) hours as needed for pain.   15 tablet   0   . Multiple Vitamin (MULTIVITAMIN WITH MINERALS) TABS   Oral   Take 1 tablet by mouth daily.          BP 116/68  Pulse 95  Temp(Src) 98.6 F (37 C) (Oral)  Resp 14  Ht 5\' 8"  (1.727 m)  Wt 160 lb (72.576 kg)  BMI 24.33 kg/m2  SpO2 99% Physical Exam  Nursing note and vitals reviewed. Constitutional: He is oriented to person, place, and time. He appears well-developed and well-nourished.  HENT:  Head: Normocephalic.  Eyes: Pupils are equal, round, and reactive to light.  Neck: Normal range of motion.  Cardiovascular: Normal rate, regular rhythm and normal heart sounds.   Pulmonary/Chest: Effort normal.  Abdominal: Soft.  Neurological: He is alert and oriented to person, place, and time. He has normal reflexes.  Skin: Skin is warm.  Psychiatric: He has a normal mood and affect.    ED Course  Procedures (including critical care time) Labs Review Labs Reviewed  URINE RAPID DRUG SCREEN (HOSP PERFORMED) - Abnormal; Notable for the following:    Opiates POSITIVE (*)    Amphetamines POSITIVE (*)    Tetrahydrocannabinol POSITIVE (*)    All other components within  normal limits  URINALYSIS, ROUTINE W REFLEX MICROSCOPIC - Abnormal; Notable for the following:    Ketones, ur 15 (*)    All other components within normal limits   Imaging Review No results found.  MDM   1. Substance abuse or dependence    Pt given clonidine, zofran and benadryl.   Pt feels better.  I obtained treatment program list from [facility].  Pt also given number for intensive outpt treatment program.   Pt given rx for clonidine and zofran      Elson Areas, PA-C 03/19/13 2012  Lonia Skinner [place], [place] 03/19/13 2014

## 2013-03-20 NOTE — ED Provider Notes (Signed)
Medical screening examination/treatment/procedure(s) were performed by non-physician practitioner and as supervising physician I was immediately available for consultation/collaboration.   Audree Camel, MD 03/20/13 [id number]

## 2013-04-26 ENCOUNTER — Encounter: Payer: Self-pay | Admitting: Family Medicine

## 2013-04-26 ENCOUNTER — Ambulatory Visit (INDEPENDENT_AMBULATORY_CARE_PROVIDER_SITE_OTHER): Payer: PRIVATE HEALTH INSURANCE | Admitting: Family Medicine

## 2013-04-26 VITALS — BP 126/64 | HR 85 | Temp 98.5°F | Resp 16 | Wt 165.5 lb

## 2013-04-26 DIAGNOSIS — B360 Pityriasis versicolor: Secondary | ICD-10-CM

## 2013-04-26 MED ORDER — KETOCONAZOLE 2 % EX CREA: TOPICAL_CREAM | Freq: Every day | CUTANEOUS | Status: AC

## 2013-04-26 NOTE — Assessment & Plan Note (Signed)
New.  Pt's sxs and PE consistent w/ tinea versicolor.  Start ketoconzaole daily.  Reviewed supportive care and red flags that should prompt return.  Pt expressed understanding and is in agreement w/ plan.

## 2013-04-26 NOTE — Patient Instructions (Addendum)
Apply the Ketozonazole cream daily Try and keep skin clean and dry Hang in there!!  Tinea Versicolor Tinea versicolor is a common yeast infection of the skin. This condition becomes known when the yeast on our skin starts to overgrow (yeast is a normal inhabitant on our skin). This condition is noticed as white or light brown patches on brown skin, and is more evident in the summer on tanned skin. These areas are slightly scaly if scratched. The light patches from the yeast become evident when the yeast creates "holes in your suntan". This is most often noticed in the summer. The patches are usually located on the chest, back, pubis, neck and body folds. However, it may occur on any area of body. Mild itching and inflammation (redness or soreness) may be present. DIAGNOSIS  The diagnosisof this is made clinically (by looking). Cultures from samples are usually not needed. Examination under the microscope may help. However, yeast is normally found on skin. The diagnosis still remains clinical. Examination under Wood's Ultraviolet Light can determine the extent of the infection. TREATMENT  This common infection is usually only of cosmetic (only a concern to your appearance). It is easily treated with dandruff shampoo used during showers or bathing. Vigorous scrubbing will eliminate the yeast over several days time. The light areas in your skin may remain for weeks or months after the infection is cured unless your skin is exposed to sunlight. The lighter or darker spots caused by the fungus that remain after complete treatment are not a sign of treatment failure; it will take a long time to resolve. Your caregiver may recommend a number of commercial preparations or medication by mouth if home care is not working. Recurrence is common and preventative medication may be necessary. This skin condition is not highly contagious. Special care is not needed to protect close friends and family members. Normal  hygiene is usually enough. Follow up is required only if you develop complications (such as a secondary infection from scratching), if recommended by your caregiver, or if no relief is obtained from the preparations used. Document Released: 07/08/2000 Document Revised: 10/03/2011 Document Reviewed: 08/20/2008 [facility] Patient Information 2014 [place], [place].

## 2013-04-26 NOTE — Progress Notes (Signed)
  Subjective:    Patient ID: Nicholas [name], male    DOB: 1991/03/07, 22 y.o.   MRN: [id number]  HPI Rash- pt reports started 'as the size of a dime' 2 yrs ago.  Has now enlarged to encompass abd, chest, back.  Intermittently itchy.  More pronounced w/ heat or sweat.  Tends to improve during the winter.   Review of Systems For ROS see HPI     Objective:   Physical Exam  Vitals reviewed. Constitutional: He appears well-developed and well-nourished. No distress.  Skin: Skin is warm and dry. Rash (tan, slightly raised, diffuse papules on trunk) noted.          Assessment & Plan:

## 2014-09-21 ENCOUNTER — Emergency Department (HOSPITAL_BASED_OUTPATIENT_CLINIC_OR_DEPARTMENT_OTHER)
Admission: EM | Admit: 2014-09-21 | Discharge: 2014-09-21 | Disposition: A | Discharge status: Home / Self Care | Payer: Commercial Managed Care - PPO | Attending: Emergency Medicine | Admitting: Emergency Medicine

## 2014-09-21 ENCOUNTER — Emergency Department (HOSPITAL_BASED_OUTPATIENT_CLINIC_OR_DEPARTMENT_OTHER): Payer: Commercial Managed Care - PPO

## 2014-09-21 ENCOUNTER — Encounter (HOSPITAL_BASED_OUTPATIENT_CLINIC_OR_DEPARTMENT_OTHER): Payer: Self-pay | Admitting: Emergency Medicine

## 2014-09-21 DIAGNOSIS — F1510 Other stimulant abuse, uncomplicated: Secondary | ICD-10-CM | POA: Insufficient documentation

## 2014-09-21 DIAGNOSIS — Z8659 Personal history of other mental and behavioral disorders: Secondary | ICD-10-CM | POA: Insufficient documentation

## 2014-09-21 DIAGNOSIS — Z79899 Other long term (current) drug therapy: Secondary | ICD-10-CM | POA: Insufficient documentation

## 2014-09-21 DIAGNOSIS — Y998 Other external cause status: Secondary | ICD-10-CM | POA: Diagnosis not present

## 2014-09-21 DIAGNOSIS — Y92410 Unspecified street and highway as the place of occurrence of the external cause: Secondary | ICD-10-CM | POA: Diagnosis not present

## 2014-09-21 DIAGNOSIS — J45909 Unspecified asthma, uncomplicated: Secondary | ICD-10-CM | POA: Insufficient documentation

## 2014-09-21 DIAGNOSIS — S299XXA Unspecified injury of thorax, initial encounter: Secondary | ICD-10-CM | POA: Diagnosis present

## 2014-09-21 DIAGNOSIS — F1210 Cannabis abuse, uncomplicated: Secondary | ICD-10-CM | POA: Diagnosis not present

## 2014-09-21 DIAGNOSIS — S20212A Contusion of left front wall of thorax, initial encounter: Secondary | ICD-10-CM | POA: Diagnosis not present

## 2014-09-21 DIAGNOSIS — T148XXA Other injury of unspecified body region, initial encounter: Secondary | ICD-10-CM

## 2014-09-21 DIAGNOSIS — Y9389 Activity, other specified: Secondary | ICD-10-CM | POA: Insufficient documentation

## 2014-09-21 DIAGNOSIS — Z720 Tobacco use: Secondary | ICD-10-CM | POA: Diagnosis not present

## 2014-09-21 LAB — RAPID URINE DRUG SCREEN, HOSP PERFORMED
AMPHETAMINES: POSITIVE — AB
BENZODIAZEPINES: NOT DETECTED
Barbiturates: NOT DETECTED
Cocaine: NOT DETECTED
Opiates: NOT DETECTED
Tetrahydrocannabinol: POSITIVE — AB

## 2014-09-21 LAB — CBC WITH DIFFERENTIAL/PLATELET
BASOS ABS: 0 10*3/uL (ref 0.0–0.1)
BASOS PCT: 0 % (ref 0–1)
EOS ABS: 0 10*3/uL (ref 0.0–0.7)
Eosinophils Relative: 0 % (ref 0–5)
HEMATOCRIT: 42.6 % (ref 39.0–52.0)
Hemoglobin: 14.7 g/dL (ref 13.0–17.0)
Lymphocytes Relative: 14 % (ref 12–46)
Lymphs Abs: 1.8 10*3/uL (ref 0.7–4.0)
MCH: 30.4 pg (ref 26.0–34.0)
MCHC: 34.5 g/dL (ref 30.0–36.0)
MCV: 88 fL (ref 78.0–100.0)
MONO ABS: 0.8 10*3/uL (ref 0.1–1.0)
Monocytes Relative: 6 % (ref 3–12)
NEUTROS ABS: 10.4 10*3/uL — AB (ref 1.7–7.7)
NEUTROS PCT: 80 % — AB (ref 43–77)
PLATELETS: 304 10*3/uL (ref 150–400)
RBC: 4.84 MIL/uL (ref 4.22–5.81)
RDW: 12 % (ref 11.5–15.5)
WBC: 13.1 10*3/uL — ABNORMAL HIGH (ref 4.0–10.5)

## 2014-09-21 MED ORDER — KETOROLAC TROMETHAMINE 30 MG/ML IJ SOLN
30.0000 mg | Freq: Once | INTRAMUSCULAR | Status: AC
  Administered 2014-09-21: 30 mg via INTRAVENOUS
  Filled 2014-09-21: qty 1

## 2014-09-21 MED ORDER — AMOXICILLIN 500 MG PO CAPS: 500.0000 mg | ORAL_CAPSULE | Freq: Three times a day (TID) | ORAL | Status: AC

## 2014-09-21 MED ORDER — IOHEXOL 300 MG/ML  SOLN
100.0000 mL | Freq: Once | INTRAMUSCULAR | Status: AC | PRN
  Administered 2014-09-21: 100 mL via INTRAVENOUS

## 2014-09-21 MED ORDER — METHOCARBAMOL 500 MG PO TABS
1000.0000 mg | ORAL_TABLET | Freq: Once | ORAL | Status: AC
  Administered 2014-09-21: 1000 mg via ORAL
  Filled 2014-09-21: qty 2

## 2014-09-21 MED ORDER — MELOXICAM 7.5 MG PO TABS: 7.5000 mg | ORAL_TABLET | Freq: Every day | ORAL | Status: AC

## 2014-09-21 MED ORDER — METHOCARBAMOL 500 MG PO TABS: 500.0000 mg | ORAL_TABLET | Freq: Two times a day (BID) | ORAL | Status: AC
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
## 2014-09-21 NOTE — Discharge Instructions (Signed)
Cryotherapy °Cryotherapy means treatment with cold. Ice or gel packs can be used to reduce both pain and swelling. Ice is the most helpful within the first 24 to 48 hours after an injury or flare-up from overusing a muscle or joint. Sprains, strains, spasms, burning pain, shooting pain, and aches can all be eased with ice. Ice can also be used when recovering from surgery. Ice is effective, has very few side effects, and is safe for most people to use. °PRECAUTIONS  °Ice is not a safe treatment option for people with: °· Raynaud phenomenon. This is a condition affecting small blood vessels in the extremities. Exposure to cold may cause your problems to return. °· Cold hypersensitivity. There are many forms of cold hypersensitivity, including: °¨ Cold urticaria. Red, itchy hives appear on the skin when the tissues begin to warm after being iced. °¨ Cold erythema. This is a red, itchy rash caused by exposure to cold. °¨ Cold hemoglobinuria. Red blood cells break down when the tissues begin to warm after being iced. The hemoglobin that carry oxygen are passed into the urine because they cannot combine with blood proteins fast enough. °· Numbness or altered sensitivity in the area being iced. °If you have any of the following conditions, do not use ice until you have discussed cryotherapy with your caregiver: °· Heart conditions, such as arrhythmia, angina, or chronic heart disease. °· High blood pressure. °· Healing wounds or open skin in the area being iced. °· Current infections. °· Rheumatoid arthritis. °· Poor circulation. °· Diabetes. °Ice slows the blood flow in the region it is applied. This is beneficial when trying to stop inflamed tissues from spreading irritating chemicals to surrounding tissues. However, if you expose your skin to cold temperatures for too long or without the proper protection, you can damage your skin or nerves. Watch for signs of skin damage due to cold. °HOME CARE INSTRUCTIONS °Follow  these tips to use ice and cold packs safely. °· Place a dry or damp towel between the ice and skin. A damp towel will cool the skin more quickly, so you may need to shorten the time that the ice is used. °· For a more rapid response, add gentle compression to the ice. °· Ice for no more than 10 to 20 minutes at a time. The bonier the area you are icing, the less time it will take to get the benefits of ice. °· Check your skin after 5 minutes to make sure there are no signs of a poor response to cold or skin damage. °· Rest 20 minutes or more between uses. °· Once your skin is numb, you can end your treatment. You can test numbness by very lightly touching your skin. The touch should be so light that you do not see the skin dimple from the pressure of your fingertip. When using ice, most people will feel these normal sensations in this order: cold, burning, aching, and numbness. °· Do not use ice on someone who cannot communicate their responses to pain, such as small children or people with dementia. °HOW TO MAKE AN ICE PACK °Ice packs are the most common way to use ice therapy. Other methods include ice massage, ice baths, and cryosprays. Muscle creams that cause a cold, tingly feeling do not offer the same benefits that ice offers and should not be used as a substitute unless recommended by your caregiver. °To make an ice pack, do one of the following: °· Place crushed ice or a   bag of frozen vegetables in a sealable plastic bag. Squeeze out the excess air. Place this bag inside another plastic bag. Slide the bag into a pillowcase or place a damp towel between your skin and the bag. °· Mix 3 parts water with 1 part rubbing alcohol. Freeze the mixture in a sealable plastic bag. When you remove the mixture from the freezer, it will be slushy. Squeeze out the excess air. Place this bag inside another plastic bag. Slide the bag into a pillowcase or place a damp towel between your skin and the bag. °SEEK MEDICAL CARE  IF: °· You develop white spots on your skin. This may give the skin a blotchy (mottled) appearance. °· Your skin turns blue or pale. °· Your skin becomes waxy or hard. °· Your swelling gets worse. °MAKE SURE YOU:  °· Understand these instructions. °· Will watch your condition. °· Will get help right away if you are not doing well or get worse. °Document Released: 03/07/2011 Document Revised: 11/25/2013 Document Reviewed: 03/07/2011 °[employer]® Patient Information ©2015 [employer]. This information is not intended to replace advice given to you by your health care provider. Make sure you discuss any questions you have with your health care provider. ° °
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
## 2014-09-21 NOTE — ED Notes (Signed)
Patient transported back to room from radiology, family at bedside.

## 2014-09-24 LAB — I-STAT CHEM 8, ED
BUN: 21 mg/dL (ref 6–23)
CALCIUM ION: 1.21 mmol/L (ref 1.12–1.23)
CHLORIDE: 102 mmol/L (ref 96–112)
CREATININE: 1.1 mg/dL (ref 0.50–1.35)
Glucose, Bld: 130 mg/dL — ABNORMAL HIGH (ref 70–99)
Potassium: 3.5 mmol/L (ref 3.5–5.1)
Sodium: 140 mmol/L (ref 135–145)
TCO2: 23 mmol/L (ref 0–100)

## 2016-10-25 ENCOUNTER — Emergency Department (HOSPITAL_COMMUNITY): Payer: Commercial Managed Care - PPO

## 2016-10-25 ENCOUNTER — Emergency Department (HOSPITAL_COMMUNITY)
Admission: EM | Admit: 2016-10-25 | Discharge: 2016-10-26 | Disposition: A | Discharge status: Home / Self Care | Payer: Commercial Managed Care - PPO | Attending: Emergency Medicine | Admitting: Emergency Medicine

## 2016-10-25 ENCOUNTER — Encounter (HOSPITAL_COMMUNITY): Payer: Self-pay

## 2016-10-25 DIAGNOSIS — F17210 Nicotine dependence, cigarettes, uncomplicated: Secondary | ICD-10-CM | POA: Diagnosis not present

## 2016-10-25 DIAGNOSIS — N50811 Right testicular pain: Secondary | ICD-10-CM | POA: Insufficient documentation

## 2016-10-25 DIAGNOSIS — Z79899 Other long term (current) drug therapy: Secondary | ICD-10-CM | POA: Diagnosis not present

## 2016-10-25 DIAGNOSIS — R1030 Lower abdominal pain, unspecified: Secondary | ICD-10-CM | POA: Diagnosis present

## 2016-10-25 DIAGNOSIS — J45909 Unspecified asthma, uncomplicated: Secondary | ICD-10-CM | POA: Insufficient documentation

## 2016-10-25 DIAGNOSIS — F909 Attention-deficit hyperactivity disorder, unspecified type: Secondary | ICD-10-CM | POA: Insufficient documentation

## 2016-10-25 DIAGNOSIS — R1031 Right lower quadrant pain: Secondary | ICD-10-CM | POA: Diagnosis not present

## 2016-10-25 LAB — URINALYSIS, ROUTINE W REFLEX MICROSCOPIC
Bilirubin Urine: NEGATIVE
Glucose, UA: NEGATIVE mg/dL
Hgb urine dipstick: NEGATIVE
Ketones, ur: NEGATIVE mg/dL
Leukocytes, UA: NEGATIVE
NITRITE: NEGATIVE
PH: 7 (ref 5.0–8.0)
Protein, ur: NEGATIVE mg/dL
SPECIFIC GRAVITY, URINE: 1.017 (ref 1.005–1.030)

## 2016-10-25 LAB — CBC WITH DIFFERENTIAL/PLATELET
BASOS PCT: 1 %
Basophils Absolute: 0.1 10*3/uL (ref 0.0–0.1)
EOS ABS: 0.3 10*3/uL (ref 0.0–0.7)
Eosinophils Relative: 3 %
HEMATOCRIT: 42.1 % (ref 39.0–52.0)
HEMOGLOBIN: 14.7 g/dL (ref 13.0–17.0)
LYMPHS ABS: 2.6 10*3/uL (ref 0.7–4.0)
Lymphocytes Relative: 31 %
MCH: 30.7 pg (ref 26.0–34.0)
MCHC: 34.9 g/dL (ref 30.0–36.0)
MCV: 87.9 fL (ref 78.0–100.0)
MONOS PCT: 7 %
Monocytes Absolute: 0.6 10*3/uL (ref 0.1–1.0)
NEUTROS PCT: 58 %
Neutro Abs: 4.9 10*3/uL (ref 1.7–7.7)
Platelets: 349 10*3/uL (ref 150–400)
RBC: 4.79 MIL/uL (ref 4.22–5.81)
RDW: 12.9 % (ref 11.5–15.5)
WBC: 8.4 10*3/uL (ref 4.0–10.5)

## 2016-10-25 LAB — BASIC METABOLIC PANEL
Anion gap: 8 (ref 5–15)
BUN: 16 mg/dL (ref 6–20)
CHLORIDE: 102 mmol/L (ref 101–111)
CO2: 29 mmol/L (ref 22–32)
Calcium: 9.1 mg/dL (ref 8.9–10.3)
Creatinine, Ser: 1.17 mg/dL (ref 0.61–1.24)
GFR calc non Af Amer: >60 mL/min (ref >60)
Glucose, Bld: 98 mg/dL (ref 65–99)
POTASSIUM: 3.7 mmol/L (ref 3.5–5.1)
SODIUM: 139 mmol/L (ref 135–145)

## 2016-10-25 MED ORDER — IOPAMIDOL (ISOVUE-300) INJECTION 61%
INTRAVENOUS | Status: AC
  Administered 2016-10-25: 100 mL
  Filled 2016-10-25: qty 100

## 2016-10-25 MED ORDER — HYDROCODONE-ACETAMINOPHEN 5-325 MG PO TABS: 1.0000 | ORAL_TABLET | ORAL | 0 refills | Status: AC | PRN

## 2016-10-25 MED ORDER — MORPHINE SULFATE (PF) 2 MG/ML IV SOLN
4.0000 mg | Freq: Once | INTRAVENOUS | Status: AC
  Administered 2016-10-25: 4 mg via INTRAVENOUS
  Filled 2016-10-25: qty 2

## 2016-10-25 NOTE — ED Notes (Signed)
Gave pt urinal for urine sample

## 2016-10-25 NOTE — ED Provider Notes (Signed)
[facility]-EMERGENCY DEPT Provider Note   CSN: [id number] Arrival date & time: 10/25/16  2002    By signing my name below, I, Valentino Saxon, attest that this documentation has been prepared under the direction and in the presence of Taahir Grisby A Erynn Vaca PA-C,. Electronically Signed: Valentino Saxon, ED Scribe. 10/25/16. 8:58 PM.  History   Chief Complaint Chief Complaint  Patient presents with  . Testicle Pain   The history is provided by the patient. No language interpreter was used.   HPI Comments: ITAI BARBIAN is a 26 y.o. male who presents to the Emergency Department complaining of 10/10, gradually worsening, constant, lower abdominal pain that occurred three days ago. He denies recent trauma or injury to the affected area. Pt notes his pain radiates to his groin area, near his testicle. 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 He also reports associated mild lower back pain. Pt reports taking Ibuprofen at home today at ~6pm with no relief. He denies scrotal swelling, penile discharge, urinary symptoms, fever, nausea, vomiting.   Past Medical History:  Diagnosis Date  . ADD (attention deficit disorder)   . Asthma    childhood    Patient Active Problem List   Diagnosis Date Noted  . Asthma 02/16/2011  . ADD (attention deficit disorder) 02/16/2011  . Tinea versicolor 02/16/2011  . Tobacco abuse 02/16/2011    Past Surgical History:  Procedure Laterality Date  . EXTERNAL EAR SURGERY  2010   bilateral "plastic surgery"       Home Medications    Prior to Admission medications   Medication Sig Start Date End Date Taking? Authorizing Provider  acetaminophen (TYLENOL) 325 MG tablet Take 325 mg by mouth every 6 (six) hours as needed. pain    Historical Provider, MD  amoxicillin (AMOXIL) 500 MG capsule Take 1 capsule (500 mg total) by mouth 3 (three) times daily. 09/21/14   April Palumbo, MD  ciprofloxacin (CIPRO) 500 MG tablet Take 1 tablet  (500 mg total) by mouth every 12 (twelve) hours. 07/17/12   Elpidio Anis, PA-C  cloNIDine (CATAPRES) 0.2 MG tablet Take 1 tablet (0.2 mg total) by mouth 2 (two) times daily. 03/19/13   Elson Areas, PA-C  HYDROcodone-acetaminophen (NORCO/VICODIN) 5-325 MG per tablet Take 1-2 tablets by mouth every 4 (four) hours as needed for pain. 07/17/12   Elpidio Anis, PA-C  ketoconazole (NIZORAL) 2 % cream Apply topically daily. 04/26/13   Sheliah Hatch, MD  meloxicam (MOBIC) 7.5 MG tablet Take 1 tablet (7.5 mg total) by mouth daily. 09/21/14   April Palumbo, MD  methocarbamol (ROBAXIN) 500 MG tablet Take 1 tablet (500 mg total) by mouth 2 (two) times daily. 09/21/14   April Palumbo, MD  Multiple Vitamin (MULTIVITAMIN WITH MINERALS) TABS Take 1 tablet by mouth daily.    Historical Provider, MD  ondansetron (ZOFRAN ODT) 4 MG disintegrating tablet Take 1 tablet (4 mg total) by mouth every 8 (eight) hours as needed for nausea. 03/19/13   Elson Areas, PA-C    Family History Family History  Problem Relation Age of Onset  . Arthritis Mother   . Diabetes Maternal Grandmother     Social History Social History  Substance Use Topics  . Smoking status: Current Every Day Smoker    Years: 6.00    Types: Cigarettes  . Smokeless tobacco: Never Used  . Alcohol use Yes     Allergies   Patient has no known allergies.  Review of Systems Review of Systems  Constitutional: Negative for fever.  Respiratory: Negative for shortness of breath.   Cardiovascular: Negative for chest pain.  Gastrointestinal: Positive for abdominal pain. Negative for nausea and vomiting.  Genitourinary: Positive for testicular pain. Negative for discharge, dysuria, flank pain and scrotal swelling.  Musculoskeletal: Negative for back pain and myalgias.  Skin: Negative for rash.  Neurological: Negative for weakness and light-headedness.     Physical Exam Updated Vital Signs BP 131/74 (BP Location: Left Arm)   Pulse 98    Temp 98.8 F (37.1 C) (Oral)   Resp 18   Ht  (1.727 m)   Wt 207 lb (93.9 kg)   SpO2 100%   BMI 31.47 kg/m   Physical Exam  Constitutional: He appears well-developed and well-nourished.  Appears uncomfortable.   HENT:  Head: Normocephalic and atraumatic.  Eyes: Conjunctivae are normal. Right eye exhibits no discharge. Left eye exhibits no discharge.  Pulmonary/Chest: Effort normal. No respiratory distress.  Abdominal: There is tenderness.  RLQ tenderness, there is no hernia present. No inguinal adenopathy.   Genitourinary: Prostate normal and penis normal.  Genitourinary Comments: Circumcised penis. No discharge. 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 Right testicular tenderness without swelling or mass. No scrotal edema. No hernia. No enlargement or tenderness of prostate.  Neurological: He is alert. Coordination normal.  Skin: Skin is warm and dry. No rash noted. He is not diaphoretic. No erythema.  Psychiatric: He has a normal mood and affect.  Nursing note and vitals reviewed.    ED Treatments / Results   DIAGNOSTIC STUDIES: Oxygen Saturation is 100% on RA, normal by my interpretation.    COORDINATION OF CARE: 8:49 PM Discussed treatment plan with pt at bedside which includes US imaging and pt agreed to plan.   Labs (all labs ordered are listed, but only abnormal results are displayed) Labs Reviewed - No data to display Results for orders placed or performed during the hospital encounter of 10/25/16  CBC with Differential  Result Value Ref Range   WBC 8.4 4.0 - 10.5 K/uL   RBC 4.79 4.22 - 5.81 MIL/uL   Hemoglobin 14.7 13.0 - 17.0 g/dL   HCT 16.1 09.6 - 04.5 %   MCV 87.9 78.0 - 100.0 fL   MCH 30.7 26.0 - 34.0 pg   MCHC 34.9 30.0 - 36.0 g/dL   RDW 40.9 81.1 - 91.4 %   Platelets 349 150 - 400 K/uL   Neutrophils Relative % 58 %   Neutro Abs 4.9 1.7 - 7.7 K/uL   Lymphocytes Relative 31 %   Lymphs Abs 2.6 0.7 - 4.0 K/uL   Monocytes Relative 7 %   Monocytes Absolute 0.6 0.1 -  1.0 K/uL   Eosinophils Relative 3 %   Eosinophils Absolute 0.3 0.0 - 0.7 K/uL   Basophils Relative 1 %   Basophils Absolute 0.1 0.0 - 0.1 K/uL  Basic metabolic panel  Result Value Ref Range   Sodium 139 135 - 145 mmol/L   Potassium 3.7 3.5 - 5.1 mmol/L   Chloride 102 101 - 111 mmol/L   CO2 29 22 - 32 mmol/L   Glucose, Bld 98 65 - 99 mg/dL   BUN 16 6 - 20 mg/dL   Creatinine, Ser 7.82 0.61 - 1.24 mg/dL   Calcium 9.1 8.9 - 95.6 mg/dL   GFR calc non Af Amer >60 >60 mL/min   GFR calc Af Amer >60 >60 mL/min   Anion gap 8 5 - 15  Urinalysis, Routine w reflex microscopic  Result Value Ref Range   Color, Urine YELLOW YELLOW   APPearance CLEAR CLEAR   Specific Gravity, Urine 1.017 1.005 - 1.030   pH 7.0 5.0 - 8.0   Glucose, UA NEGATIVE NEGATIVE mg/dL   Hgb urine dipstick NEGATIVE NEGATIVE   Bilirubin Urine NEGATIVE NEGATIVE   Ketones, ur NEGATIVE NEGATIVE mg/dL   Protein, ur NEGATIVE NEGATIVE mg/dL   Nitrite NEGATIVE NEGATIVE   Leukocytes, UA NEGATIVE NEGATIVE   US Scrotum  Result Date: 10/25/2016 CLINICAL DATA:  Initial evaluation for acute right abdominal and right lower quadrant pain extending into right scrotum. EXAM: ULTRASOUND OF SCROTUM TECHNIQUE: Complete ultrasound examination of the testicles, epididymis, and other scrotal structures was performed. COMPARISON:  None. FINDINGS: Right testicle Measurements: 5.0 x 2.5 x 3.8 cm. No mass or microlithiasis visualized. Left testicle Not visualized. Right epididymis:  Normal in size and appearance. Left epididymis:  Not visualized. Hydrocele:  None visualized. Varicocele:  None visualized. IMPRESSION: 1. Normal sonographic appearance of the right testicle. No acute abnormality identified. 2. Nonvisualization of the left testicle. Electronically Signed   By: Rise Mu M.D.   On: 10/25/2016 22:23   Ct Abdomen Pelvis W Contrast  Result Date: 10/25/2016 CLINICAL DATA:  Gradually worsening lower abdominal pain for 3 days. EXAM: CT  ABDOMEN AND PELVIS WITH CONTRAST TECHNIQUE: Multidetector CT imaging of the abdomen and pelvis was performed using the standard protocol following bolus administration of intravenous contrast. CONTRAST:  ISOVUE-300 IOPAMIDOL (ISOVUE-300) INJECTION 61% COMPARISON:  None. FINDINGS: Lower chest: No acute abnormality. Hepatobiliary: No focal liver abnormality is seen. No gallstones, gallbladder wall thickening, or biliary dilatation. Pancreas: Unremarkable. No pancreatic ductal dilatation or surrounding inflammatory changes. Spleen: Normal in size without focal abnormality. Adrenals/Urinary Tract: Adrenal glands are unremarkable. Kidneys are normal, without renal calculi, focal lesion, or hydronephrosis. Bladder is unremarkable. Stomach/Bowel: Stomach is within normal limits. Appendix is normal. No evidence of bowel wall thickening, distention, or inflammatory changes. Vascular/Lymphatic: No significant vascular findings are present. No enlarged abdominal or pelvic lymph nodes. Reproductive: Unremarkable Other: No focal inflammation. No ascites. Small fat containing umbilical hernia. Musculoskeletal: No acute or significant osseous findings. IMPRESSION: No significant abnormality. Electronically Signed   By: Ellery Plunk M.D.   On: 10/25/2016 23:12   [place] Art/ven Flow Abd Pelv Doppler  Result Date: 10/25/2016 CLINICAL DATA:  Initial evaluation for acute right abdominal and right lower quadrant pain extending into right scrotum. EXAM: ULTRASOUND OF SCROTUM TECHNIQUE: Complete ultrasound examination of the testicles, epididymis, and other scrotal structures was performed. COMPARISON:  None. FINDINGS: Right testicle Measurements: 5.0 x 2.5 x 3.8 cm. No mass or microlithiasis visualized. Left testicle Not visualized. Right epididymis:  Normal in size and appearance. Left epididymis:  Not visualized. Hydrocele:  None visualized. Varicocele:  None visualized. IMPRESSION: 1. Normal sonographic appearance of the  right testicle. No acute abnormality identified. 2. Nonvisualization of the left testicle. Electronically Signed   By: Rise Mu M.D.   On: 10/25/2016 22:23    EKG  EKG Interpretation None       Radiology No results found.  Procedures Procedures (including critical care time)  Medications Ordered in ED Medications - No data to display   Initial Impression / Assessment and Plan / ED Course  I have reviewed the triage vital signs and the nursing notes.  Pertinent labs & imaging results that were available during my care of the patient were reviewed by me and considered in my medical  decision making (see chart for details).     Patient presents with right groin and RLQ abdominal pain. He denies fever, vomiting or loss of appetite.  Scrotal US ordered. No abnormalities. CT to evaluate appendix is normal, also without significant abnormalities. Labs reassuring. VSS.He can be discharged home and should return with any worsening symptoms or new concerns.   Final Clinical Impressions(s) / ED Diagnoses   Final diagnoses:  None   1. Abdominal pain 2. Testicular pain New Prescriptions New Prescriptions   No medications on file    I personally performed the services described in this documentation, which was scribed in my presence. The recorded information has been reviewed and is accurate.      Elpidio Anis, PA-C 10/26/16 0124    Lorre Nick, MD 11/02/16 [phone]

## 2016-10-25 NOTE — ED Triage Notes (Signed)
Pt states that he has been experiencing lower abdominal pain that radiates in to his testicle (monorchism). He states that he first noticed it 2 weeks ago, but the last 3 days have been more painful. Denies urinary symptoms. A&Ox4.

## 2016-10-25 NOTE — ED Notes (Signed)
US at bedside

## 2017-05-04 ENCOUNTER — Ambulatory Visit (INDEPENDENT_AMBULATORY_CARE_PROVIDER_SITE_OTHER): Payer: Self-pay | Admitting: Physician Assistant

## 2017-05-04 ENCOUNTER — Encounter: Payer: Self-pay | Admitting: Physician Assistant

## 2017-05-04 VITALS — HR 67 | Resp 16 | Ht 68.0 in | Wt 213.6 lb

## 2017-05-04 DIAGNOSIS — L03031 Cellulitis of right toe: Secondary | ICD-10-CM

## 2017-05-05 NOTE — Progress Notes (Signed)
    05/05/2017 8:17 AM   DOB: [birth year] / MRN: [id number]  SUBJECTIVE:  Nicholas [name] is a 26 y.o. male presenting for right medial great toe pain that started about three days ago and is worsening. No known injury.   He has No Known Allergies.   He  has a past medical history of ADD (attention deficit disorder) and Asthma.    He  reports that he has been smoking Cigarettes.  He has smoked for the past 6.00 years. He has never used smokeless tobacco. He reports that he drinks alcohol. He reports that he uses drugs, including Methamphetamines and IV. He  has no sexual activity history on file. The patient  has a past surgical history that includes External ear surgery (2010).  His family history includes Arthritis in his mother; Diabetes in his maternal grandmother.  Review of Systems  Constitutional: Negative for chills, diaphoresis and fever.  Gastrointestinal: Negative for nausea.  Skin: Negative for rash.  Neurological: Negative for dizziness.    The problem list and medications were reviewed and updated by myself where necessary and exist elsewhere in the encounter.   OBJECTIVE:  Pulse 67   Resp 16   Ht  (1.727 m)   Wt 213 lb 9.6 oz (96.9 kg)   SpO2 98%   BMI 32.48 kg/m   Physical Exam  Constitutional: He appears well-developed. He is active and cooperative.  Non-toxic appearance.  Cardiovascular: Normal rate.   Pulmonary/Chest: Effort normal. No tachypnea.  Musculoskeletal:       Feet:  Neurological: He is alert.  Skin: Skin is warm and dry. He is not diaphoretic. No pallor.  Vitals reviewed.  Risk and benefits discussed and verbal consent obtained. Anesthetic allergies reviewed. Patient anesthetized using 1:1 mix of 2% lidocaine without epi. A 1 cm incision was made using a number 11 blade and purulent material was expressed.  The was not wound packed. The patient tolerated the procedure without difficulty.   A clean dressing was placed and wound care  instructions were provided.    No results found for this or any previous visit (from the past 72 hour(s)).  No results found.  ASSESSMENT AND PLAN:  Nicholas [name] was seen today for toe injury.  Diagnoses and all orders for this visit:  Acute paronychia of toe of right foot: Drained. Wound culture. Keflex. RTC PRN.    The patient is advised to call or return to clinic if he does not see an improvement in symptoms, or to seek the care of the closest emergency department if he worsens with the above plan.   Deliah Boston, MHS, PA-C Primary Care at [facility] 05/05/2017 8:17 AM

## 2017-05-09 LAB — WOUND CULTURE

## 2018-08-03 IMAGING — CT CT ABD-PELV W/ CM
2 of 4 series · 16 of 46 positions shown, 18 images · IV contrast (ISOVUE)
Comparison: None.

CLINICAL DATA: Gradually worsening lower abdominal pain for 3 days.

EXAM:
CT ABDOMEN AND PELVIS WITH CONTRAST
TECHNIQUE: Multidetector CT imaging of the abdomen and pelvis was performed
using the standard protocol following bolus administration of
intravenous contrast.
CONTRAST:  100mL PUXF3A-WQQ IOPAMIDOL (PUXF3A-WQQ) INJECTION 61%

[Series 2: abd/pel with · axial · 0.74mm/px · z∈[-418,-28]mm · 13 of 88 slices shown, 15 images]
[im 5/88  soft-tissue]
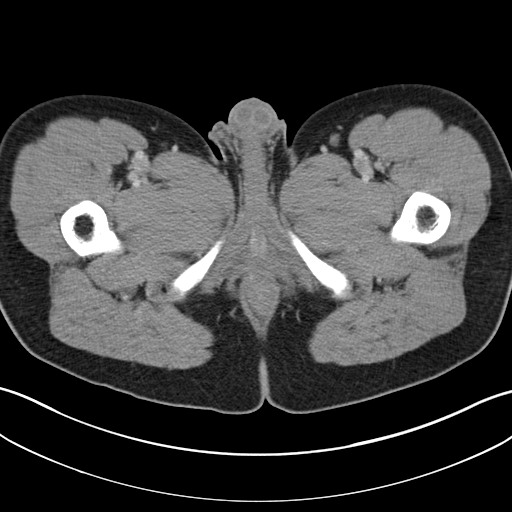
[im 5/88  bone]
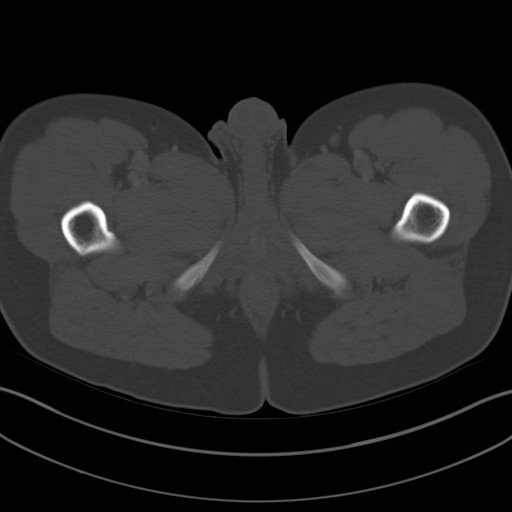
[im 14/88  soft-tissue]
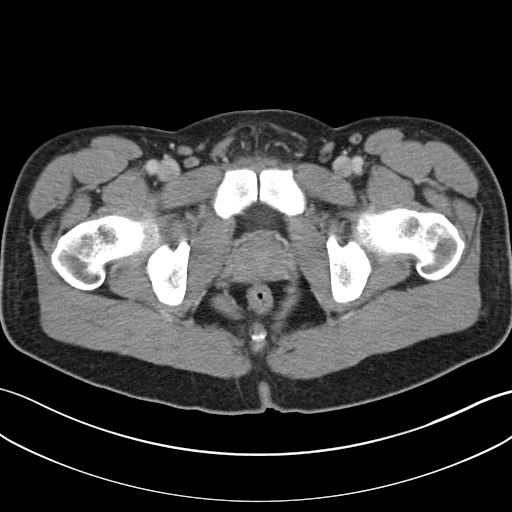
[im 19/88  soft-tissue]
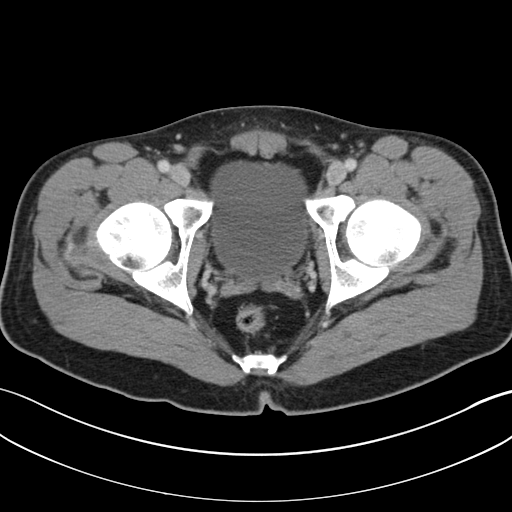
[im 23/88  soft-tissue]
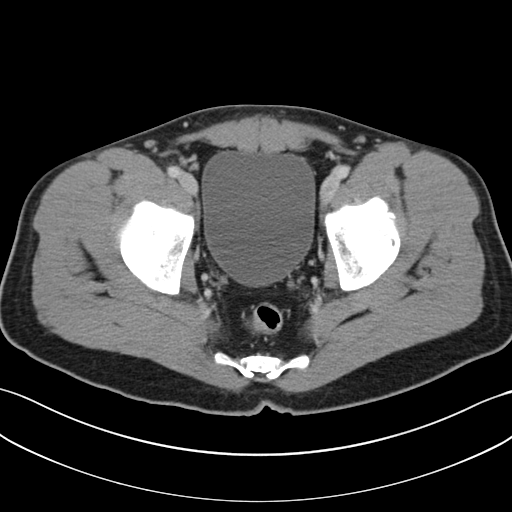
[im 33/88  soft-tissue]
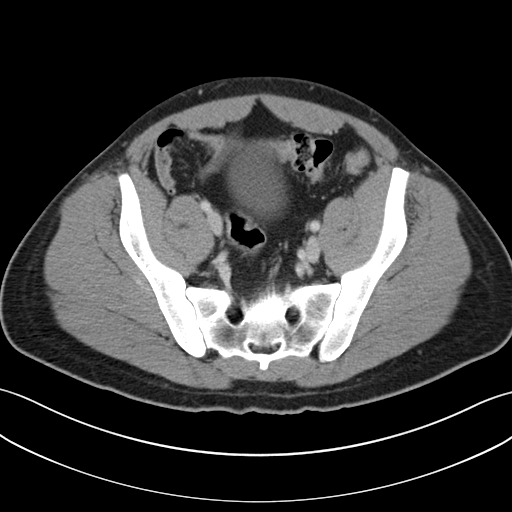
[im 37/88  soft-tissue]
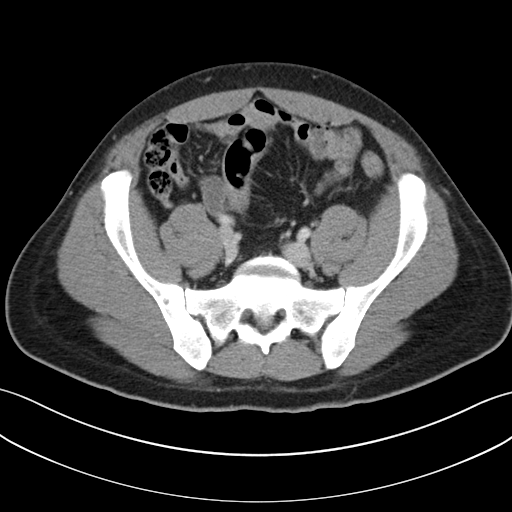
[im 46/88  soft-tissue]
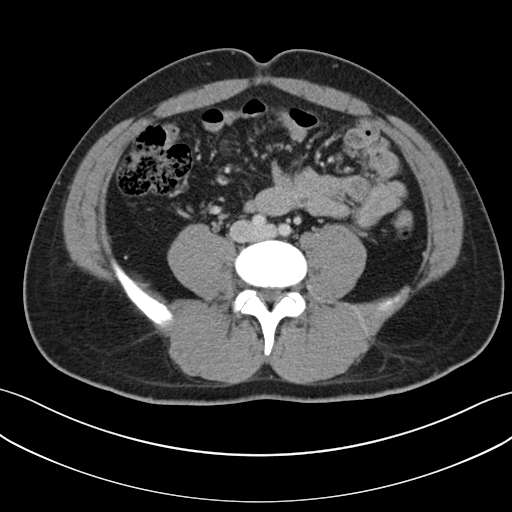
[im 51/88  soft-tissue]
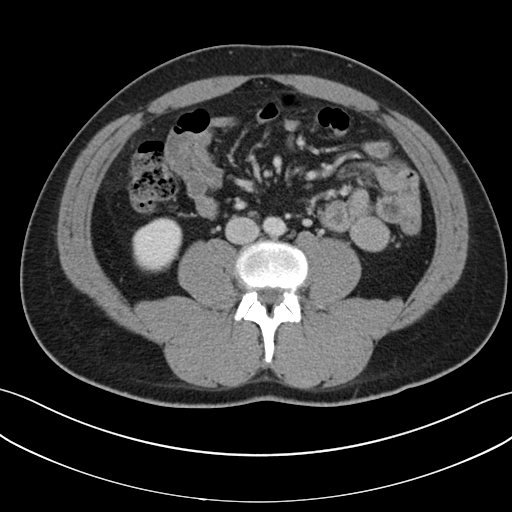
[im 55/88  soft-tissue]
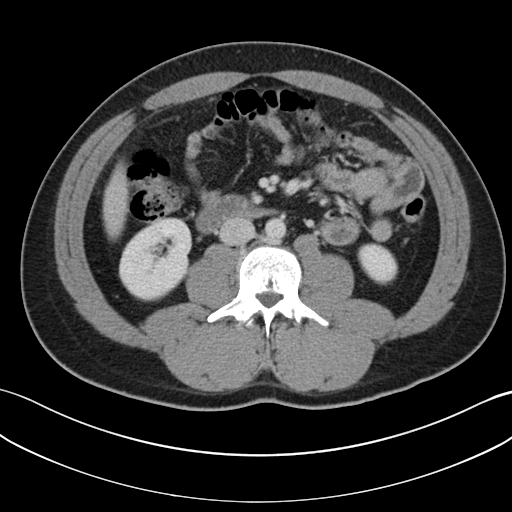
[im 55/88  bone]
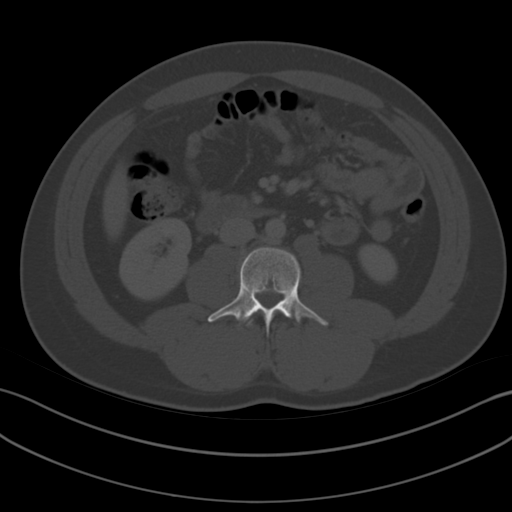
[im 65/88  soft-tissue]
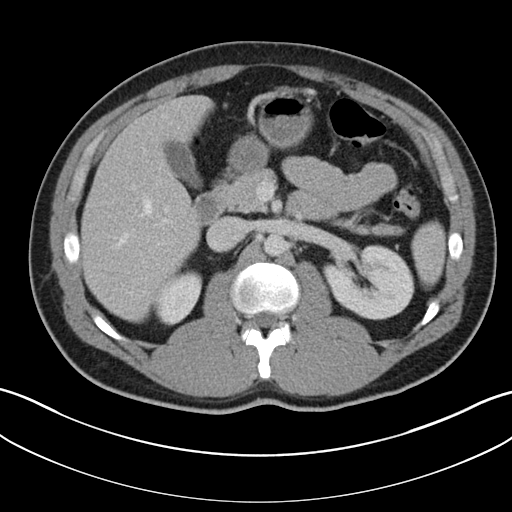
[im 69/88  soft-tissue]
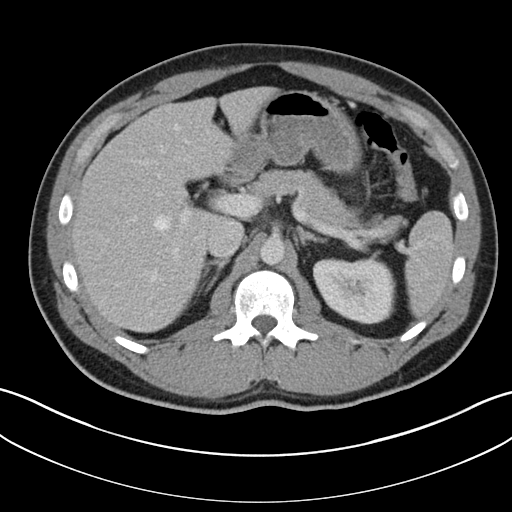
[im 74/88  soft-tissue]
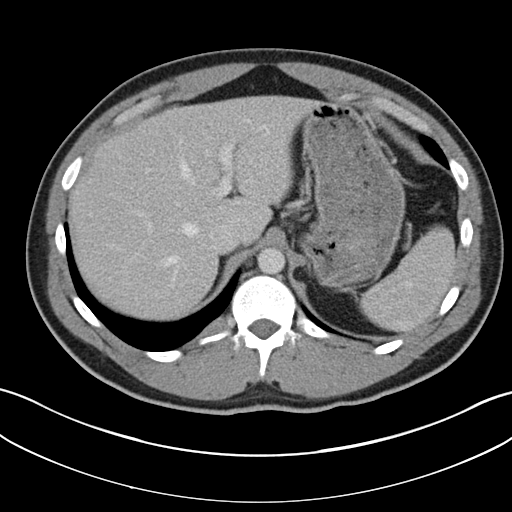
[im 83/88  soft-tissue]
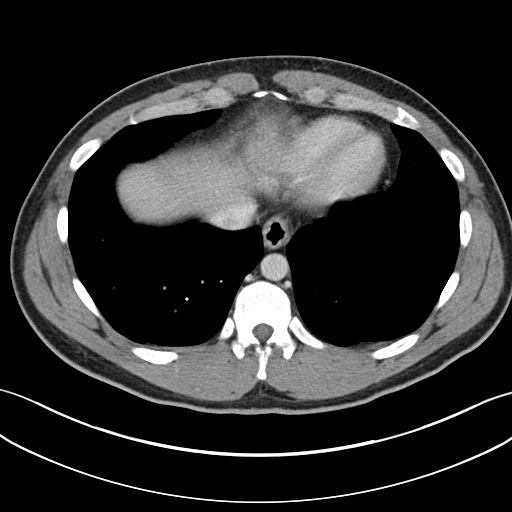

[Series 4: coronal a/|p · coronal · 0.72mm/px · 3 of 137 slices shown]
[im 46/137  soft-tissue]
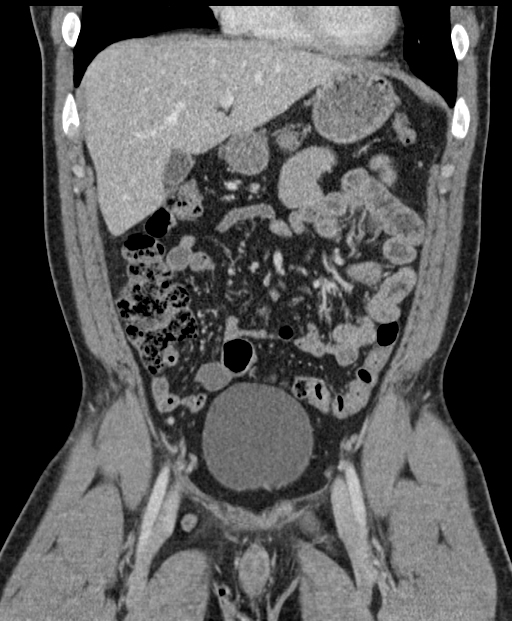
[im 61/137  soft-tissue]
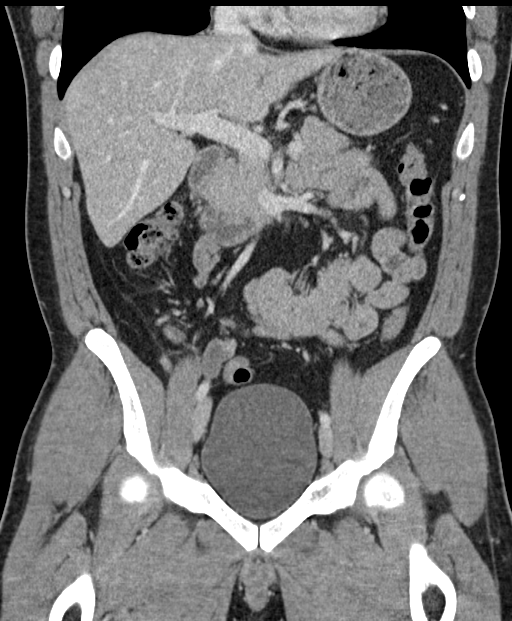
[im 76/137  soft-tissue]
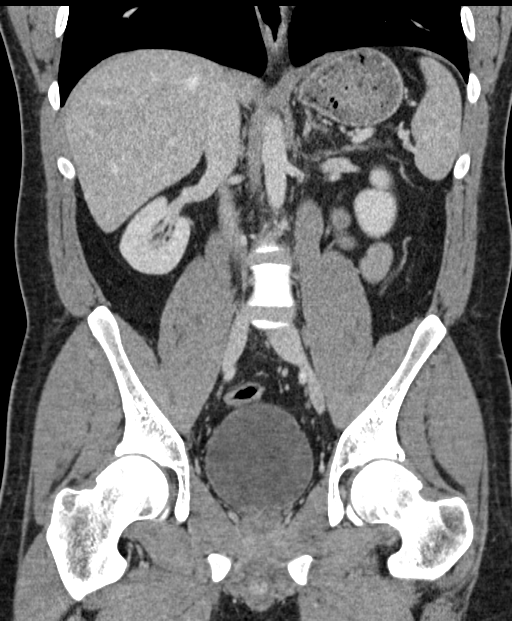

[16 of 46 positions shown; findings below may reference images not displayed]

FINDINGS: Lower chest: No acute abnormality.

Hepatobiliary: No focal liver abnormality is seen. No gallstones,
gallbladder wall thickening, or biliary dilatation.

Pancreas: Unremarkable. No pancreatic ductal dilatation or
surrounding inflammatory changes.

Spleen: Normal in size without focal abnormality.

Adrenals/Urinary Tract: Adrenal glands are unremarkable. Kidneys are
normal, without renal calculi, focal lesion, or hydronephrosis.
Bladder is unremarkable.

Stomach/Bowel: Stomach is within normal limits. Appendix is normal.
No evidence of bowel wall thickening, distention, or inflammatory
changes.

Vascular/Lymphatic: No significant vascular findings are present. No
enlarged abdominal or pelvic lymph nodes.

Reproductive: Unremarkable

Other: No focal inflammation. No ascites. Small fat containing
umbilical hernia.

Musculoskeletal: No acute or significant osseous findings.
IMPRESSION: No significant abnormality.

## 2023-06-03 ENCOUNTER — Telehealth: Payer: Self-pay | Admitting: Physician Assistant

## 2023-06-05 ENCOUNTER — Telehealth: Payer: Self-pay | Admitting: Physician Assistant

## 2023-06-05 DIAGNOSIS — R0602 Shortness of breath: Secondary | ICD-10-CM

## 2023-06-05 DIAGNOSIS — R5383 Other fatigue: Secondary | ICD-10-CM

## 2023-06-05 DIAGNOSIS — R6889 Other general symptoms and signs: Secondary | ICD-10-CM

## 2023-06-05 DIAGNOSIS — R079 Chest pain, unspecified: Secondary | ICD-10-CM

## 2023-06-05 NOTE — Patient Instructions (Signed)
  Nicholas [name], thank you for joining [name], PA-C for today's virtual visit.  While this provider is not your primary care provider (PCP), if your PCP is located in our provider database this encounter information will be shared with them immediately following your visit.   A [facility] MyChart account gives you access to today's visit and all your visits, tests, and labs performed at [facility] " click here if you don't have a [facility] MyChart account or go to [email or web address]  Consent: (Patient) Nicholas [name] provided verbal consent for this virtual visit at the beginning of the encounter.  Current Medications: No current outpatient medications on file.   Medications ordered in this encounter:  No orders of the defined types were placed in this encounter.    *If you need refills on other medications prior to your next appointment, please contact your pharmacy*  Follow-Up: Call back or seek an in-person evaluation if the symptoms worsen or if the condition fails to improve as anticipated.  [facility] [phone]  Other Instructions Report to the nearest emergency department for evaluation.  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   If you have been instructed to have an in-person evaluation today at a local Urgent Care facility, please use the link below. It will take you to a list of all of our available [facility], including address, phone number and hours of operation. Please do not delay care.  [facility]  If you or a family member do not have a primary care provider, use the link below to schedule a visit and establish care. When you choose a [facility] primary care physician or advanced practice provider, you gain a long-term partner in health. Find a Primary Care Provider  Learn more about [facility]'s in-office and virtual care options: [facility] - Get Care  Now

## 2023-06-05 NOTE — Progress Notes (Signed)
Virtual Visit Consent   Nicholas [name], you are scheduled for a virtual visit with a [facility] provider today. Just as with appointments in the office, your consent must be obtained to participate. Your consent will be active for this visit and any virtual visit you may have with one of our providers in the next 365 days. If you have a MyChart account, a copy of this consent can be sent to you electronically.  As this is a virtual visit, video technology does not allow for your provider to perform a traditional examination. This may limit your provider's ability to fully assess your condition. If your provider identifies any concerns that need to be evaluated in person or the need to arrange testing (such as labs, EKG, etc.), we will make arrangements to do so. Although advances in technology are sophisticated, we cannot ensure that it will always work on either your end or our end. If the connection with a video visit is poor, the visit may have to be switched to a telephone visit. With either a video or telephone visit, we are not always able to ensure that we have a secure connection.  By engaging in this virtual visit, you consent to the provision of healthcare and authorize for your insurance to be billed (if applicable) for the services provided during this visit. Depending on your insurance coverage, you may receive a charge related to this service.  I need to obtain your verbal consent now. Are you willing to proceed with your visit today? Nicholas [name] has provided verbal consent on 06/05/2023 for a virtual visit (video or telephone). [name], [place]  Date: 06/05/2023 5:23 PM  Virtual Visit via Video Note   I, [name], connected with  Nicholas [name]  ([id number], Feb 25, 1991) on 06/05/23 at  5:15 PM EST by a video-enabled telemedicine application and verified that I am speaking with the correct person using two identifiers.  Location: Patient: Virtual Visit Location Patient:  Home Provider: Virtual Visit Location Provider: Home Office   I discussed the limitations of evaluation and management by telemedicine and the availability of in person appointments. The patient expressed understanding and agreed to proceed.    History of Present Illness: Nicholas [name] is a 32 y.o. who identifies as a male who was assigned male at birth, and is being seen today for shortness of breath chest pain fever chills and fatigue.  Patient states that he has had the symptoms for the last 4 to 5 days.  He states that his significant other was initially diagnosed with a respiratory illness after exposure at work.  He states that he stopped which is something that he typically is able to do.  He states he also has intermittent chills and fevers as high as 10 2-1 03 which is prominent present on today.  He denies any nausea vomiting diarrhea hemoptysis hematochezia melena abdominal pain or any other contributory symptoms.  Again he does endorse chest pain fatigue shortness of breath like symptoms.  He states he has had a COVID test which was negative.Marland Kitchen  HP Allergies: Not on File Medications: No current outpatient medications on file.  Observations/Objective: Patient is well-developed, well-nourished in no acute distress.  Resting comfortably  at home.  Head is normocephalic, atraumatic.  No labored breathing.  Speech is clear and coherent with logical content.  Patient is alert and oriented at baseline.    Assessment and Plan: 1. Chest pain, unspecified type  2. Flu-like symptoms  3. Other fatigue  4. Shortness of breath  Extensive conversation had with this patient regarding his symptoms.  I am concerned that he could possibly have a pneumonia versus COVID related process as the symptoms have now been ongoing for almost 1 week.  He also has fatigue formation but I think for history given the concern I recommended that the patient present to the nearest emergency department for  evaluation.  Advised him that he would not be charged for this visit on today however I will provide a work note but the expectation is that he present to the nearest emergency department for evaluation.  He verbalized understanding no additional questions at this time and stated he will report to the ER for evaluation.  Follow Up Instructions: I discussed the assessment and treatment plan with the patient. The patient was provided an opportunity to ask questions and all were answered. The patient agreed with the plan and demonstrated an understanding of the instructions.  A copy of instructions were sent to the patient via MyChart unless otherwise noted below.    The patient was advised to call back or seek an in-person evaluation if the symptoms worsen or if the condition fails to improve as anticipated.    [name], PA-C

## 2023-06-06 ENCOUNTER — Encounter: Payer: Self-pay | Admitting: Physician Assistant

## 2023-06-06 NOTE — Progress Notes (Signed)
The patient no-showed for appointment despite this provider sending direct link, reaching out via phone with no response and waiting for at least 10 minutes from appointment time for patient to join. They will be marked as a NS for this appointment/time.   [name], PA-C

## 2023-08-29 ENCOUNTER — Ambulatory Visit: Payer: Self-pay | Admitting: Internal Medicine

## 2023-11-09 ENCOUNTER — Ambulatory Visit: Payer: Self-pay | Admitting: Internal Medicine
